# Patient Record
Sex: Male | Born: 1995 | Race: Black or African American | Hispanic: No | Marital: Single | Smoking: Never smoker
Health system: Southern US, Community
[De-identification: ages and names within clinical notes are randomized; demographics above are authoritative.]

---
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
## 2017-12-09 ENCOUNTER — Emergency Department (HOSPITAL_BASED_OUTPATIENT_CLINIC_OR_DEPARTMENT_OTHER): Payer: BC Managed Care – PPO

## 2017-12-09 ENCOUNTER — Other Ambulatory Visit: Payer: Self-pay

## 2017-12-09 ENCOUNTER — Emergency Department (HOSPITAL_BASED_OUTPATIENT_CLINIC_OR_DEPARTMENT_OTHER)
Admission: EM | Admit: 2017-12-09 | Discharge: 2017-12-09 | Disposition: A | Discharge status: Home / Self Care | Payer: BC Managed Care – PPO | Attending: Emergency Medicine | Admitting: Emergency Medicine

## 2017-12-09 ENCOUNTER — Encounter (HOSPITAL_BASED_OUTPATIENT_CLINIC_OR_DEPARTMENT_OTHER): Payer: Self-pay | Admitting: Student

## 2017-12-09 DIAGNOSIS — N50819 Testicular pain, unspecified: Secondary | ICD-10-CM

## 2017-12-09 DIAGNOSIS — N50812 Left testicular pain: Secondary | ICD-10-CM | POA: Diagnosis present

## 2017-12-09 DIAGNOSIS — N433 Hydrocele, unspecified: Secondary | ICD-10-CM | POA: Diagnosis not present

## 2017-12-09 LAB — URINALYSIS, ROUTINE W REFLEX MICROSCOPIC
Glucose, UA: NEGATIVE mg/dL
Hgb urine dipstick: NEGATIVE
Ketones, ur: NEGATIVE mg/dL
Leukocytes, UA: NEGATIVE
NITRITE: NEGATIVE
Protein, ur: NEGATIVE mg/dL
pH: 6 (ref 5.0–8.0)

## 2017-12-09 NOTE — Discharge Instructions (Signed)
You were seen in the ER today for testicular pain. We did not feel a hernia on exam. Your urine showed dehydration- please drink plenty of water. Your ultrasound showed hydrocele.

## 2017-12-09 NOTE — ED Provider Notes (Signed)
[facility] EMERGENCY DEPARTMENT Provider Note   CSN: [id number] Arrival date & time: 12/09/17  1030     History   Chief Complaint Chief Complaint  Patient presents with  . Testicle Pain    HPI Jeffery Campos is a 22 y.o. male without significant past medical hx who presents to the ED with complaints of testicular pain x 1 month. Pain is located in L testicle and sometimes radiates to the groin/abdomen. Pain seems to be intermittent, more when sitting up, coughing, or heavy lifting. Testicle was initially swollen, no longer is. Other than above no specific alleviating/aggravating factors. Denies injury. Denies fever, chills, nausea, vomiting, diarrhea, penile discharge, or dysuria. Patient not currently sexually active, no concern for current STD.   HPI  No past medical history on file.  There are no active problems to display for this patient.   No past surgical history on file.      Home Medications    Prior to Admission medications   Medication Sig Start Date End Date Taking? Authorizing Provider  ibuprofen (ADVIL,MOTRIN) 600 MG tablet Take 1 tablet (600 mg total) by mouth every 8 (eight) hours as needed for fever or moderate pain. 02/21/15   Jeffery RacerYelverton, David, MD  oseltamivir (TAMIFLU) 75 MG capsule Take 1 capsule (75 mg total) by mouth 2 (two) times daily. 02/21/15   Jeffery RacerYelverton, David, MD    Family History No family history on file.  Social History Social History   Tobacco Use  . Smoking status: Never Smoker  Substance Use Topics  . Alcohol use: Not on file  . Drug use: Not on file     Allergies   Patient has no known allergies.   Review of Systems Review of Systems  Constitutional: Negative for chills and fever.  Respiratory: Negative for shortness of breath.   Cardiovascular: Negative for chest pain.  Gastrointestinal: Negative for blood in stool, constipation, diarrhea, nausea and vomiting.  Genitourinary: Positive for scrotal swelling  (resolved at present) and testicular pain (L ). Negative for dysuria, flank pain and hematuria.     Physical Exam Updated Vital Signs There were no vitals taken for this visit.  Physical Exam  Constitutional: He appears well-developed and well-nourished.  Non-toxic appearance. No distress.  HENT:  Head: Normocephalic and atraumatic.  Eyes: Conjunctivae are normal. Right eye exhibits no discharge. Left eye exhibits no discharge.  Neck: Neck supple.  Cardiovascular: Regular rhythm. Tachycardia present.  No murmur heard. Pulmonary/Chest: Effort normal and breath sounds normal. No respiratory distress. He has no wheezes. He has no rhonchi. He has no rales.  Respiration even and unlabored  Abdominal: Soft. He exhibits no distension. There is no tenderness. There is no rigidity, no rebound and no guarding. Hernia confirmed negative in the right inguinal area and confirmed negative in the left inguinal area.  Genitourinary: Cremasteric reflex is present. Right testis shows no mass and no tenderness. Left testis shows no mass and no tenderness. Circumcised. No penile erythema or penile tenderness. No discharge found.  Genitourinary Comments: L testicle mildly higher riding than the R RN present as chaperone.   Lymphadenopathy: No inguinal adenopathy noted on the right or left side.  Neurological: He is alert.  Clear speech.   Skin: Skin is warm and dry. No rash noted.  Psychiatric: He has a normal mood and affect. His behavior is normal.  Nursing note and vitals reviewed.    ED Treatments / Results  Labs (all labs ordered are listed, but only abnormal  results are displayed) Labs Reviewed  URINALYSIS, ROUTINE W REFLEX MICROSCOPIC - Abnormal; Notable for the following components:      Result Value   Specific Gravity, Urine >1.030 (*)    Bilirubin Urine SMALL (*)    All other components within normal limits  GC/CHLAMYDIA PROBE AMP ([place]) NOT AT [facility]     EKG None  Radiology US Scrotum W/doppler  Result Date: 12/09/2017 CLINICAL DATA:  Intermittent left testicular pain and swelling for 1 month. EXAM: SCROTAL ULTRASOUND DOPPLER ULTRASOUND OF THE TESTICLES TECHNIQUE: Complete ultrasound examination of the testicles, epididymis, and other scrotal structures was performed. Color and spectral Doppler ultrasound were also utilized to evaluate blood flow to the testicles. COMPARISON:  None. FINDINGS: Right testicle Measurements: 3.5 x 2.0 x 2.2 cm. No mass or microlithiasis visualized. Left testicle Measurements: 3.7 x 1.7 x 2.6 cm. No mass or microlithiasis visualized. Right epididymis:  Normal in size and appearance. Left epididymis:  Normal in size and appearance. Hydrocele:  Small bilateral hydroceles. Varicocele:  None visualized. Pulsed Doppler interrogation of both testes demonstrates normal low resistance arterial and venous waveforms bilaterally. IMPRESSION: Small bilateral hydroceles, otherwise unremarkable examination. Electronically Signed   By: Sebastian Ache M.D.   On: 12/09/2017 11:53    Procedures Procedures (including critical care time)  Medications Ordered in ED Medications - No data to display   Initial Impression / Assessment and Plan / ED Course  I have reviewed the triage vital signs and the nursing notes.  Pertinent labs & imaging results that were available during my care of the patient were reviewed by me and considered in my medical decision making (see chart for details).   Patient presents to the ED with L testicular pain x 1 month. Overall fairly benign exam. L testicle slightly higher riding than R, but no palpable tenderness, masses, or torsion. No hernia identified on my exam or supervising physician Dr. Ceasar Lund exam. Ultrasound with small bilateral hydroceles, otherwise negative, no torsion. Urinalysis w/ dehydration w/ elevated specific gravity- likely the cause of mild tachycardia initially however this  normalized and so did his elevated BP, discussed good hydration. No evidence of UTI, presentation does not seem consistent with prostatitis, epididymitis or orchitis. Patient without penile discharge to raise concern for STD, also not currently sexually active. Unclear definitive etiology, does not seem to be an emergent process, urology follow up. I discussed results, treatment plan, need for follow-up, and return precautions with the patient. Provided opportunity for questions, patient confirmed understanding and is in agreement with plan.    Vitals:   12/09/17 1231 12/09/17 1232  BP: 131/84   Pulse:  67  Resp:    Temp:    SpO2:  100%    Final Clinical Impressions(s) / ED Diagnoses   Final diagnoses:  Testicular pain  Hydrocele, unspecified hydrocele type    ED Discharge Orders    None       Cherly Anderson, PA-C 12/09/17 1240    Jacalyn Lefevre, MD 12/09/17 1513

## 2017-12-09 NOTE — ED Triage Notes (Signed)
Testicle pain..... For about a month

## 2017-12-10 LAB — GC/CHLAMYDIA PROBE AMP (~~LOC~~) NOT AT ARMC
Chlamydia: NEGATIVE
Neisseria Gonorrhea: NEGATIVE

## 2019-01-28 IMAGING — US US SCROTUM W/ DOPPLER COMPLETE
1 series · 14 of 25 positions shown · non-contrast
Comparison: None.

CLINICAL DATA: Intermittent left testicular pain and swelling for 1
month.

EXAM:
SCROTAL ULTRASOUND
DOPPLER ULTRASOUND OF THE TESTICLES
TECHNIQUE: Complete ultrasound examination of the testicles, epididymis, and
other scrotal structures was performed. Color and spectral Doppler
ultrasound were also utilized to evaluate blood flow to the
testicles.

[Series 1: us scrotum w/ doppler complete · 0.07mm/px · 14 of 62 slices shown]
[im 1/62]
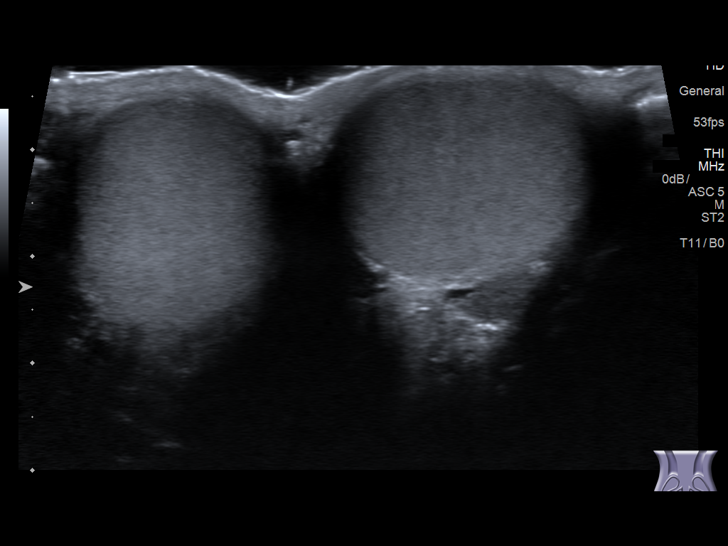
[im 6/62]
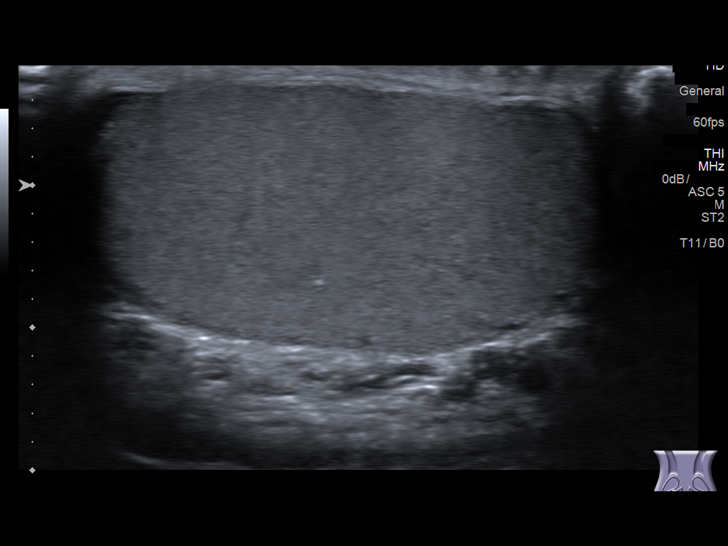
[im 11/62]
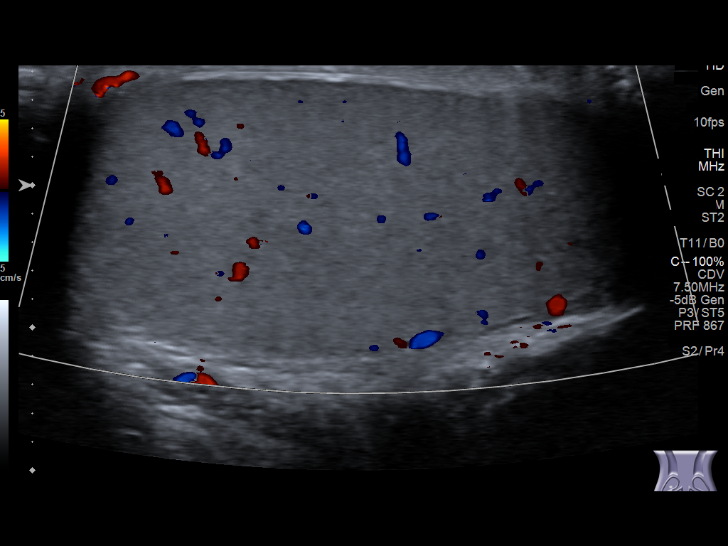
[im 16/62]
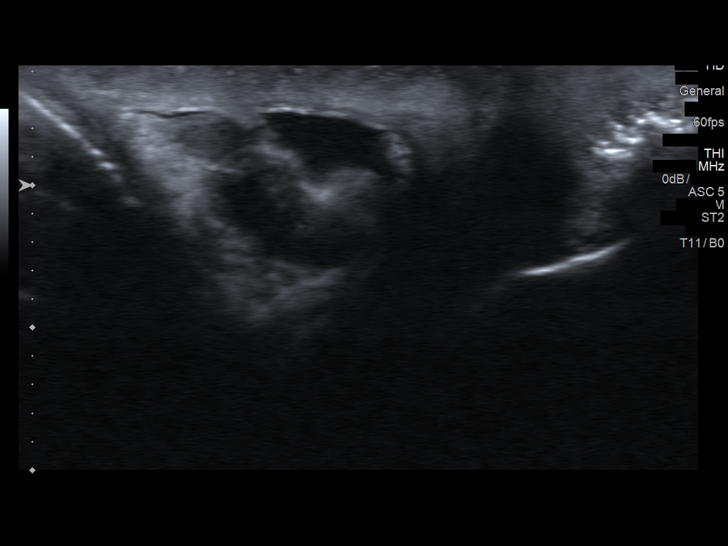
[im 21/62]
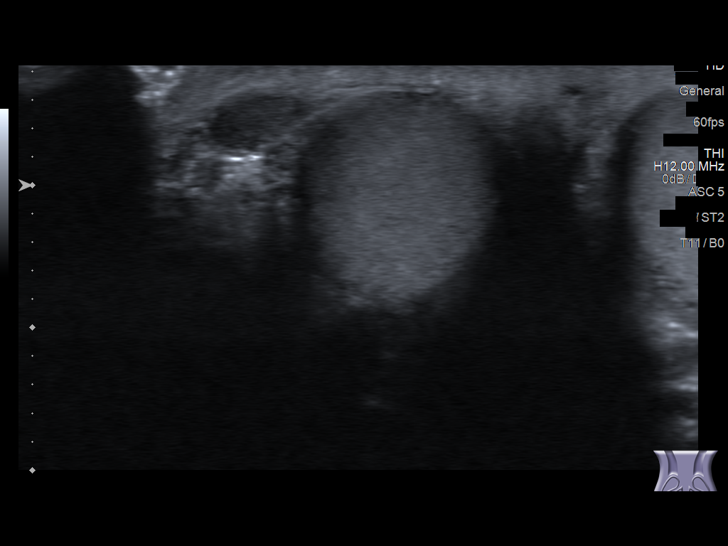
[im 23/62]
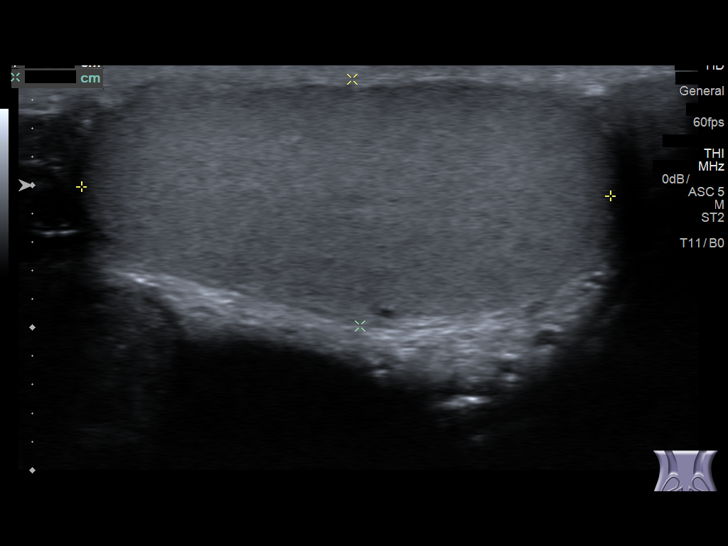
[im 28/62]
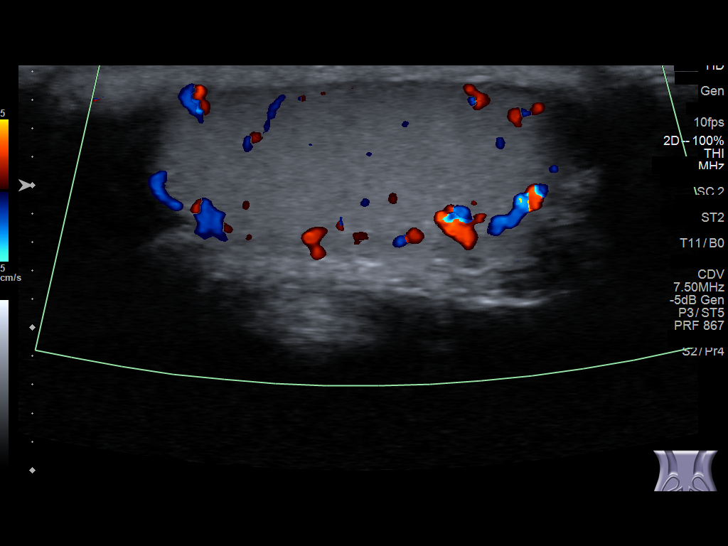
[im 34/62]
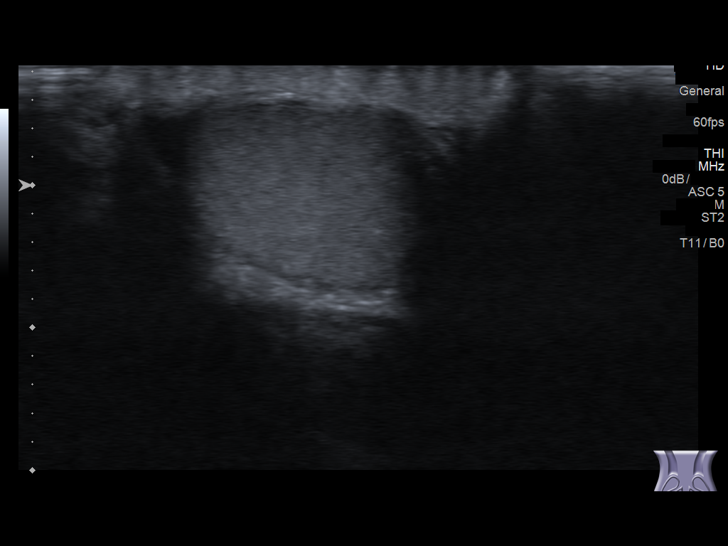
[im 39/62]
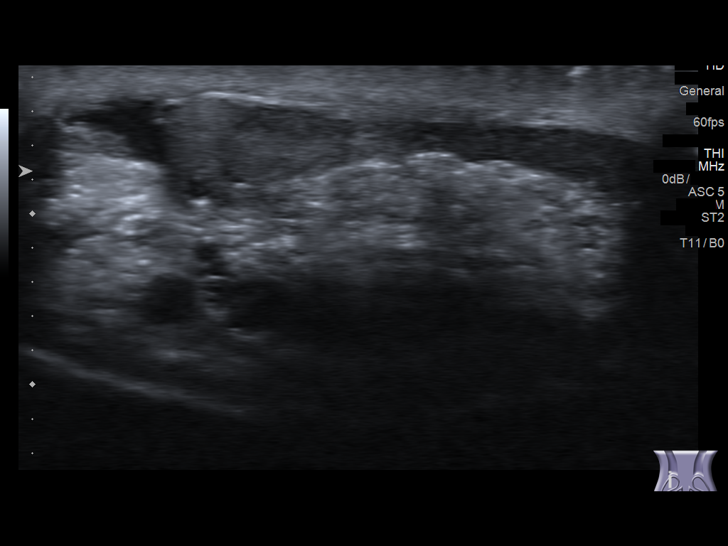
[im 41/62]
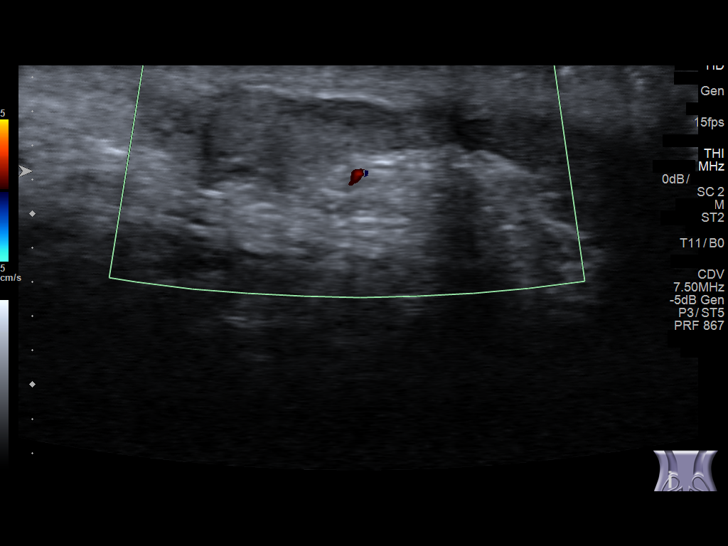
[im 46/62]
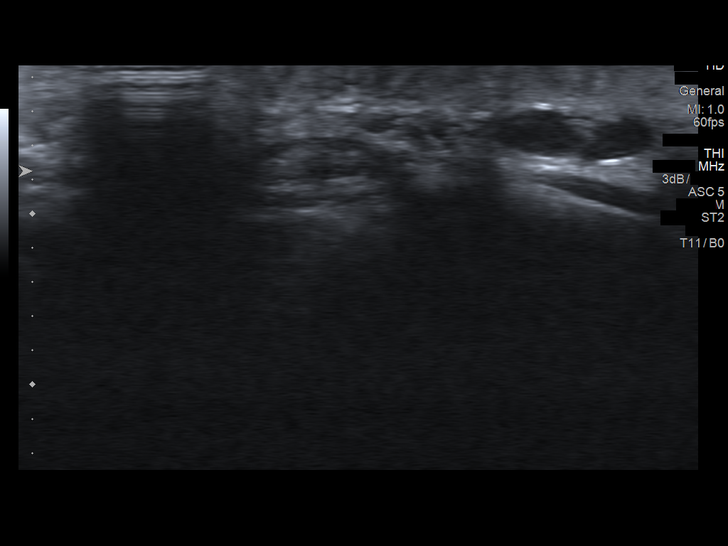
[im 51/62]
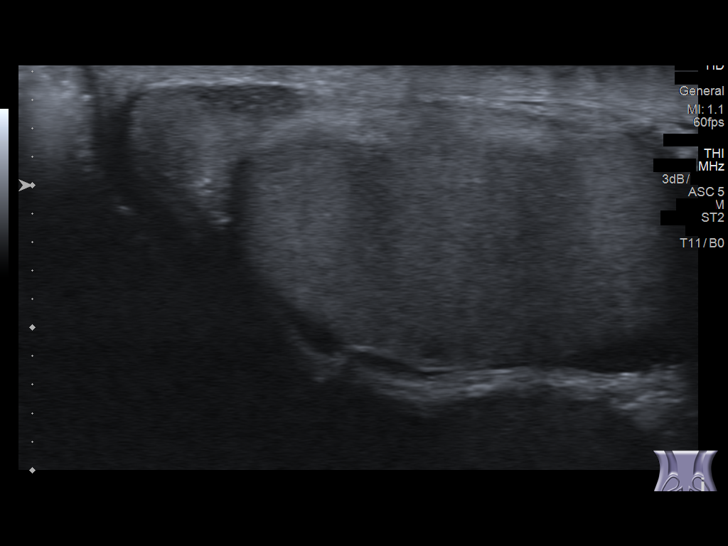
[im 56/62]
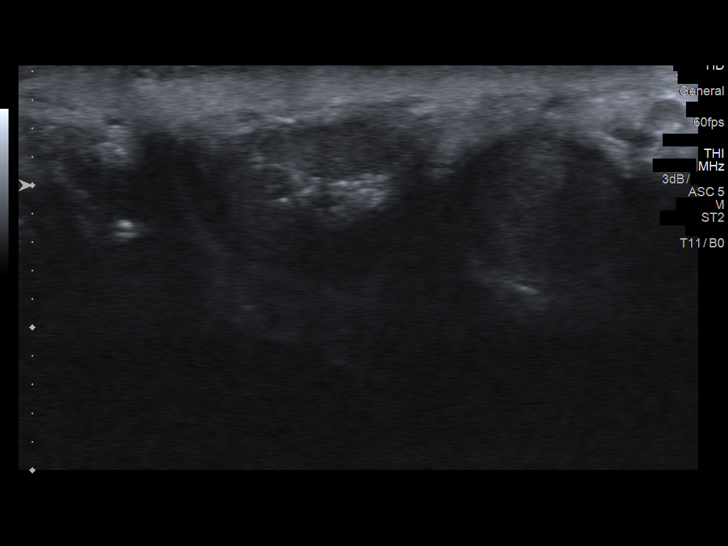
[im 62/62]
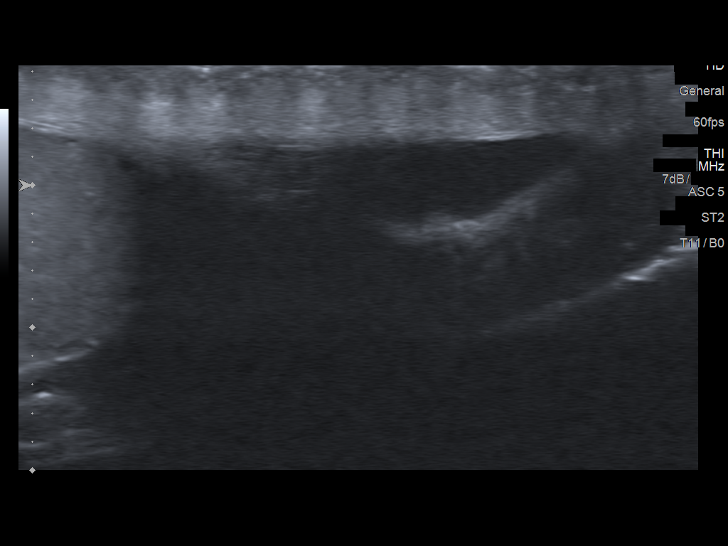

[14 of 25 positions shown; findings below may reference images not displayed]

FINDINGS: Right testicle

Measurements: 3.5 x 2.0 x 2.2 cm. No mass or microlithiasis
visualized.

Left testicle

Measurements: 3.7 x 1.7 x 2.6 cm. No mass or microlithiasis
visualized.

Right epididymis:  Normal in size and appearance.

Left epididymis:  Normal in size and appearance.

Hydrocele:  Small bilateral hydroceles.

Varicocele:  None visualized.

Pulsed Doppler interrogation of both testes demonstrates normal low
resistance arterial and venous waveforms bilaterally.
IMPRESSION: Small bilateral hydroceles, otherwise unremarkable examination.

## 2023-09-09 ENCOUNTER — Other Ambulatory Visit (HOSPITAL_COMMUNITY): Payer: Self-pay
